# Patient Record
Sex: Male | Born: 1980 | Hispanic: No | Marital: Married | Smoking: Current some day smoker
Health system: Southern US, Community
[De-identification: ages and names within clinical notes are randomized; demographics above are authoritative.]

---

## 2001-09-29 ENCOUNTER — Encounter: Payer: Self-pay | Admitting: Emergency Medicine

## 2001-09-29 ENCOUNTER — Emergency Department (HOSPITAL_COMMUNITY): Admission: EM | Admit: 2001-09-29 | Discharge: 2001-09-29 | Payer: Self-pay | Admitting: Emergency Medicine

## 2013-05-02 ENCOUNTER — Encounter (HOSPITAL_COMMUNITY): Payer: Self-pay | Admitting: *Deleted

## 2013-05-02 ENCOUNTER — Emergency Department (HOSPITAL_COMMUNITY)
Admission: EM | Admit: 2013-05-02 | Discharge: 2013-05-02 | Disposition: A | Discharge status: Home / Self Care | Payer: No Typology Code available for payment source | Attending: Emergency Medicine | Admitting: Emergency Medicine

## 2013-05-02 DIAGNOSIS — Y929 Unspecified place or not applicable: Secondary | ICD-10-CM | POA: Insufficient documentation

## 2013-05-02 DIAGNOSIS — M542 Cervicalgia: Secondary | ICD-10-CM | POA: Insufficient documentation

## 2013-05-02 DIAGNOSIS — M25512 Pain in left shoulder: Secondary | ICD-10-CM

## 2013-05-02 DIAGNOSIS — M25519 Pain in unspecified shoulder: Secondary | ICD-10-CM | POA: Insufficient documentation

## 2013-05-02 DIAGNOSIS — M546 Pain in thoracic spine: Secondary | ICD-10-CM | POA: Insufficient documentation

## 2013-05-02 DIAGNOSIS — Y999 Unspecified external cause status: Secondary | ICD-10-CM | POA: Insufficient documentation

## 2013-05-02 MED ORDER — IBUPROFEN 800 MG PO TABS: 800.0000 mg | ORAL_TABLET | Freq: Three times a day (TID) | ORAL | Status: AC
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
## 2013-05-02 NOTE — ED Notes (Signed)
Pt c/o left shoulder and neck pain, sts he was just seen here about a week ago for the same issue but it hasn't gotten better. Pt in nad, skin warm and dry, resp e/u, ambulatory to room.
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
## 2013-05-04 NOTE — ED Provider Notes (Signed)
Medical screening examination/treatment/procedure(s) were performed by non-physician practitioner and as supervising physician I was immediately available for consultation/collaboration.  Dione Booze, MD 05/04/13 [phone]

## 2016-01-20 ENCOUNTER — Emergency Department (HOSPITAL_COMMUNITY)
Admission: EM | Admit: 2016-01-20 | Discharge: 2016-01-20 | Disposition: A | Discharge status: Home / Self Care | Payer: No Typology Code available for payment source | Attending: Emergency Medicine | Admitting: Emergency Medicine

## 2016-01-20 ENCOUNTER — Encounter (HOSPITAL_COMMUNITY): Payer: Self-pay | Admitting: Emergency Medicine

## 2016-01-20 ENCOUNTER — Emergency Department (HOSPITAL_COMMUNITY): Payer: No Typology Code available for payment source

## 2016-01-20 DIAGNOSIS — R51 Headache: Secondary | ICD-10-CM | POA: Insufficient documentation

## 2016-01-20 DIAGNOSIS — R509 Fever, unspecified: Secondary | ICD-10-CM | POA: Insufficient documentation

## 2016-01-20 DIAGNOSIS — R1110 Vomiting, unspecified: Secondary | ICD-10-CM | POA: Insufficient documentation

## 2016-01-20 DIAGNOSIS — R05 Cough: Secondary | ICD-10-CM | POA: Insufficient documentation

## 2016-01-20 DIAGNOSIS — Z791 Long term (current) use of non-steroidal anti-inflammatories (NSAID): Secondary | ICD-10-CM | POA: Insufficient documentation

## 2016-01-20 DIAGNOSIS — F17200 Nicotine dependence, unspecified, uncomplicated: Secondary | ICD-10-CM | POA: Insufficient documentation

## 2016-01-20 DIAGNOSIS — R6889 Other general symptoms and signs: Secondary | ICD-10-CM

## 2016-01-20 DIAGNOSIS — R0989 Other specified symptoms and signs involving the circulatory and respiratory systems: Secondary | ICD-10-CM | POA: Insufficient documentation

## 2016-01-20 DIAGNOSIS — R062 Wheezing: Secondary | ICD-10-CM | POA: Insufficient documentation

## 2016-01-20 MED ORDER — ALBUTEROL SULFATE HFA 108 (90 BASE) MCG/ACT IN AERS
2.0000 | INHALATION_SPRAY | Freq: Once | RESPIRATORY_TRACT | Status: AC
  Administered 2016-01-20: 2 via RESPIRATORY_TRACT
  Filled 2016-01-20: qty 6.7

## 2016-01-20 MED ORDER — PREDNISONE 20 MG PO TABS: 40.0000 mg | ORAL_TABLET | Freq: Every day | ORAL | Status: AC

## 2016-01-20 MED ORDER — GUAIFENESIN-CODEINE 100-10 MG/5ML PO SOLN: 5.0000 mL | Freq: Four times a day (QID) | ORAL | Status: AC | PRN

## 2016-01-20 MED ORDER — AZITHROMYCIN 250 MG PO TABS: ORAL_TABLET | ORAL | Status: AC

## 2016-01-20 MED ORDER — AEROCHAMBER PLUS W/MASK MISC
1.0000 | Freq: Once | Status: AC
  Administered 2016-01-20: 1
  Filled 2016-01-20: qty 1

## 2016-01-20 NOTE — Discharge Instructions (Signed)
Gripe - Adultos  (Influenza, Adult)  La gripe es una infección viral del tracto respiratorio. Ocurre con más frecuencia en los meses de invierno, ya que las personas pasan más tiempo en contacto cercano. La gripe puede enfermarlo considerablemente. Se transmite fácilmente de una persona a otra (es contagiosa).  CAUSAS   La causa es un virus que infecta el tracto respiratorio. Puede contagiarse el virus al aspirar las gotitas que una persona infectada elimina al toser o estornudar. También puede contagiarse al tocar algo que fue recientemente contaminado con el virus y luego llevarse la mano a la boca, la nariz o los ojos.  RIESGOS Y COMPLICACIONES  Tendrá mayor riesgo de sufrir un resfrío grave si consume cigarrillos, es diabético, sufre una enfermedad cardíaca (como insuficiencia cardíaca) o pulmonar crónica (como asma) o si tiene debilitado el sistema inmunológico. Los ancianos y las mujeres embarazadas tienen más riesgo de sufrir infecciones graves. El problema más frecuente de la gripe es la infección pulmonar (neumonía). En algunos casos, este problema puede requerir atención médica de emergencia y poner en peligro la vida.  SIGNOS Y SÍNTOMAS   Los síntomas pueden durar entre 4 y 10 días y pueden ser:  · Fiebre.  · Escalofríos.  · Dolor de cabeza, dolores en el cuerpo y musculares.  · Dolor de garganta.  · Molestias en el pecho y tos.  · Pérdida del apetito.  · Debilidad o cansancio.  · Mareos.  · Náuseas o vómitos.  DIAGNÓSTICO   El diagnóstico se realiza según su historia clínica y un examen físico. Es necesario realizar un análisis de cultivo faríngeo o nasal para confirmar el diagnóstico.  TRATAMIENTO   En los casos leves, la gripe se cura sin tratamiento. El tratamiento está dirigido a aliviar los síntomas. En los casos más graves, el médico podrá recetar medicamentos antivirales para acortar el curso de la enfermedad. Los antibióticos no son eficaces, ya que la infección está causada por un virus y no una  bacteria.  INSTRUCCIONES PARA EL CUIDADO EN EL HOGAR   · Tome los medicamentos solamente como se lo haya indicado el médico.  · Utilice un humidificador de niebla fría para facilitar la respiración.  · Haga reposo hasta que la temperatura vuelva a ser normal. Generalmente esto lleva entre 3 y 4 días.  · Beba suficiente líquido para mantener la orina clara o de color amarillo pálido.  · Cúbrase la boca y la nariz al toser o estornudar, y lávese las manos muy bien para evitar que se propague el virus.  · Quédese en su casa y no concurra al trabajo o a la escuela hasta que la fiebre haya desaparecido al menos por un día completo.  PREVENCIÓN   La vacunación anual contra la gripe es la mejor manera de evitar enfermarse. Se recomienda ahora de manera rutinaria una vacuna anual contra la gripe a todos los adultos estadounidenses.  SOLICITE ATENCIÓN MÉDICA SI:  · Tiene dolor en el pecho, la tos empeora o tiene más mucosidad.  · Tiene náuseas, vómitos o diarrea.  · La fiebre regresa o empeora.  SOLICITE ATENCIÓN MÉDICA DE INMEDIATO SI:   · Tiene dificultad para respirar, le falta el aire o tiene la piel o las uñas azuladas.  · Presenta dolor intenso o entumecimiento en el cuello.  · Le duele la cabeza de forma repentina o tiene dolor en la cara o el oído.  · Tiene náuseas o vómitos que no puede controlar.  ASEGÚRESE DE QUE:   · Comprende   estas instrucciones.  · Controlará su afección.  · Recibirá ayuda de inmediato si no mejora o si empeora.     Esta información no tiene como fin reemplazar el consejo del médico. Asegúrese de hacerle al médico cualquier pregunta que tenga.     Document Released: 08/31/2005 Document Revised: 12/12/2014  Elsevier Interactive Patient Education ©2016 [employer]

## 2016-01-20 NOTE — ED Notes (Signed)
Patient presents with generalized body aches, emesis, fever cough x4 days.

## 2016-01-20 NOTE — ED Provider Notes (Signed)
CSN: [id number]     Arrival date & time 01/20/16  1623 History  By signing my name below, I, Phillis Haggis, attest that this documentation has been prepared under the direction and in the presence of Arthor Captain, PA-C. Electronically Signed: Phillis Haggis, ED Scribe. 01/20/2016. 7:39 PM.   Chief Complaint  Patient presents with  . Fever  . Cough  . Emesis   The history is provided by the patient. No language interpreter was used.  HPI Comments: William Graham is a 35 y.o. male who presents to the Emergency Department complaining of gradually worsening generalized myalgias onset 4 days ago. Pt reports associated emesis, fever, congestion, headache, and cough. He reports occasional alcohol consumption and smoking. He reports familial sick contacts. He has not tried anything for his symptoms. He denies hx of asthma or recent travel outside of the country.   History reviewed. No pertinent past medical history. History reviewed. No pertinent past surgical history. No family history on file. Social History  Substance Use Topics  . Smoking status: Current Some Day Smoker  . Smokeless tobacco: None  . Alcohol Use: No    Review of Systems  Constitutional: Positive for fever.  HENT: Positive for congestion.   Respiratory: Positive for cough.   Gastrointestinal: Positive for vomiting.  Musculoskeletal: Positive for myalgias.   Allergies  Review of patient's allergies indicates no known allergies.  Home Medications   Prior to Admission medications   Medication Sig Start Date End Date Taking? Authorizing Provider  acetaminophen (TYLENOL) 500 MG tablet Take 1,000 mg by mouth every 6 (six) hours as needed for pain.    Historical Provider, MD  ibuprofen (ADVIL,MOTRIN) 800 MG tablet Take 1 tablet (800 mg total) by mouth 3 (three) times daily. 05/02/13   Junius Finner, PA-C  methocarbamol (ROBAXIN) 500 MG tablet Take 500 mg by mouth 2 (two) times daily as needed (for muscle relaxant).     Historical Provider, MD  oxyCODONE-acetaminophen (PERCOCET/ROXICET) 5-325 MG per tablet Take 1 tablet by mouth every 4 (four) hours as needed for pain.    Historical Provider, MD   BP 136/95 mmHg  Pulse 82  Temp(Src) 98 F (36.7 C) (Oral)  Resp 18  SpO2 100% Physical Exam Physical Exam  Constitutional: Pt  is oriented to person, place, and time. Appears well-developed and well-nourished. No distress.  HENT:  Head: Normocephalic and atraumatic.  Right Ear: Tympanic membrane, external ear and ear canal normal.  Left Ear: Tympanic membrane, external ear and ear canal normal.  Nose: No epistaxis. Right sinus exhibits no maxillary sinus tenderness and no frontal sinus tenderness. Left sinus exhibits no maxillary sinus tenderness and no frontal sinus tenderness.  Mouth/Throat: Uvula is midline and mucous membranes are normal. Mucous membranes are not pale and not cyanotic. No oropharyngeal exudate, posterior oropharyngeal edema, posterior oropharyngeal erythema or tonsillar abscesses.  Eyes: Conjunctivae are normal. Pupils are equal, round, and reactive to light.  Neck: Normal range of motion and full passive range of motion without pain.  Cardiovascular: Normal rate and intact distal pulses.   Pulmonary/Chest: No stridor. Bilateral rhonchi and wheezing. Coughing on exam Abdominal: Soft. Bowel sounds are normal. There is no tenderness.  Musculoskeletal: Normal range of motion. Tenderness to the right latissimus Lymphadenopathy:    Pthas no cervical adenopathy.  Neurological: Pt is alert and oriented to person, place, and time.  Skin: Skin is warm and dry. No rash noted. Pt is not diaphoretic.  Psychiatric: Normal mood and affect.  Nursing note  and vitals reviewed.   ED Course  Procedures (including critical care time) DIAGNOSTIC STUDIES: Oxygen Saturation is 100% on RA, normal by my interpretation.    COORDINATION OF CARE: 7:35 PM-Discussed treatment plan which includes cough  medication and x-ray with pt at bedside and pt agreed to plan.    Labs Review Labs Reviewed - No data to display  Imaging Review Dg Chest 2 View  01/20/2016  CLINICAL DATA:  Cough for 4 days EXAM: CHEST  2 VIEW COMPARISON:  None FINDINGS: Normal heart size, mediastinal contours, and pulmonary vascularity. Mild peribronchial thickening. No pulmonary infiltrate, pleural effusion, or pneumothorax. Bones unremarkable. IMPRESSION: Bronchitic changes without infiltrate. Electronically Signed   By: Ulyses Southward M.D.   On: 01/20/2016 17:25   I have personally reviewed and evaluated these images and lab results as part of my medical decision-making.   EKG Interpretation None      MDM   Final diagnoses:  Flu-like symptoms   Pt CXR negative for acute infiltrate. Patients symptoms are consistent with URI, likely viral etiology.  Pt will be discharged with symptomatic treatment, PREDNISONE AND ZPAK.  Verbalizes understanding and is agreeable with plan. Pt is hemodynamically stable & in NAD prior to dc.   I personally performed the services described in this documentation, which was scribed in my presence. The recorded information has been reviewed and is accurate.     Arthor Captain, PA-C 01/20/16 1953  Azalia Bilis, MD 01/21/16 [phone]

## 2017-04-22 IMAGING — CR DG CHEST 2V
2 series · 2 of 2 positions shown · non-contrast
Comparison: None

CLINICAL DATA: Cough for 4 days

EXAM:
CHEST  2 VIEW

[w chest pa]
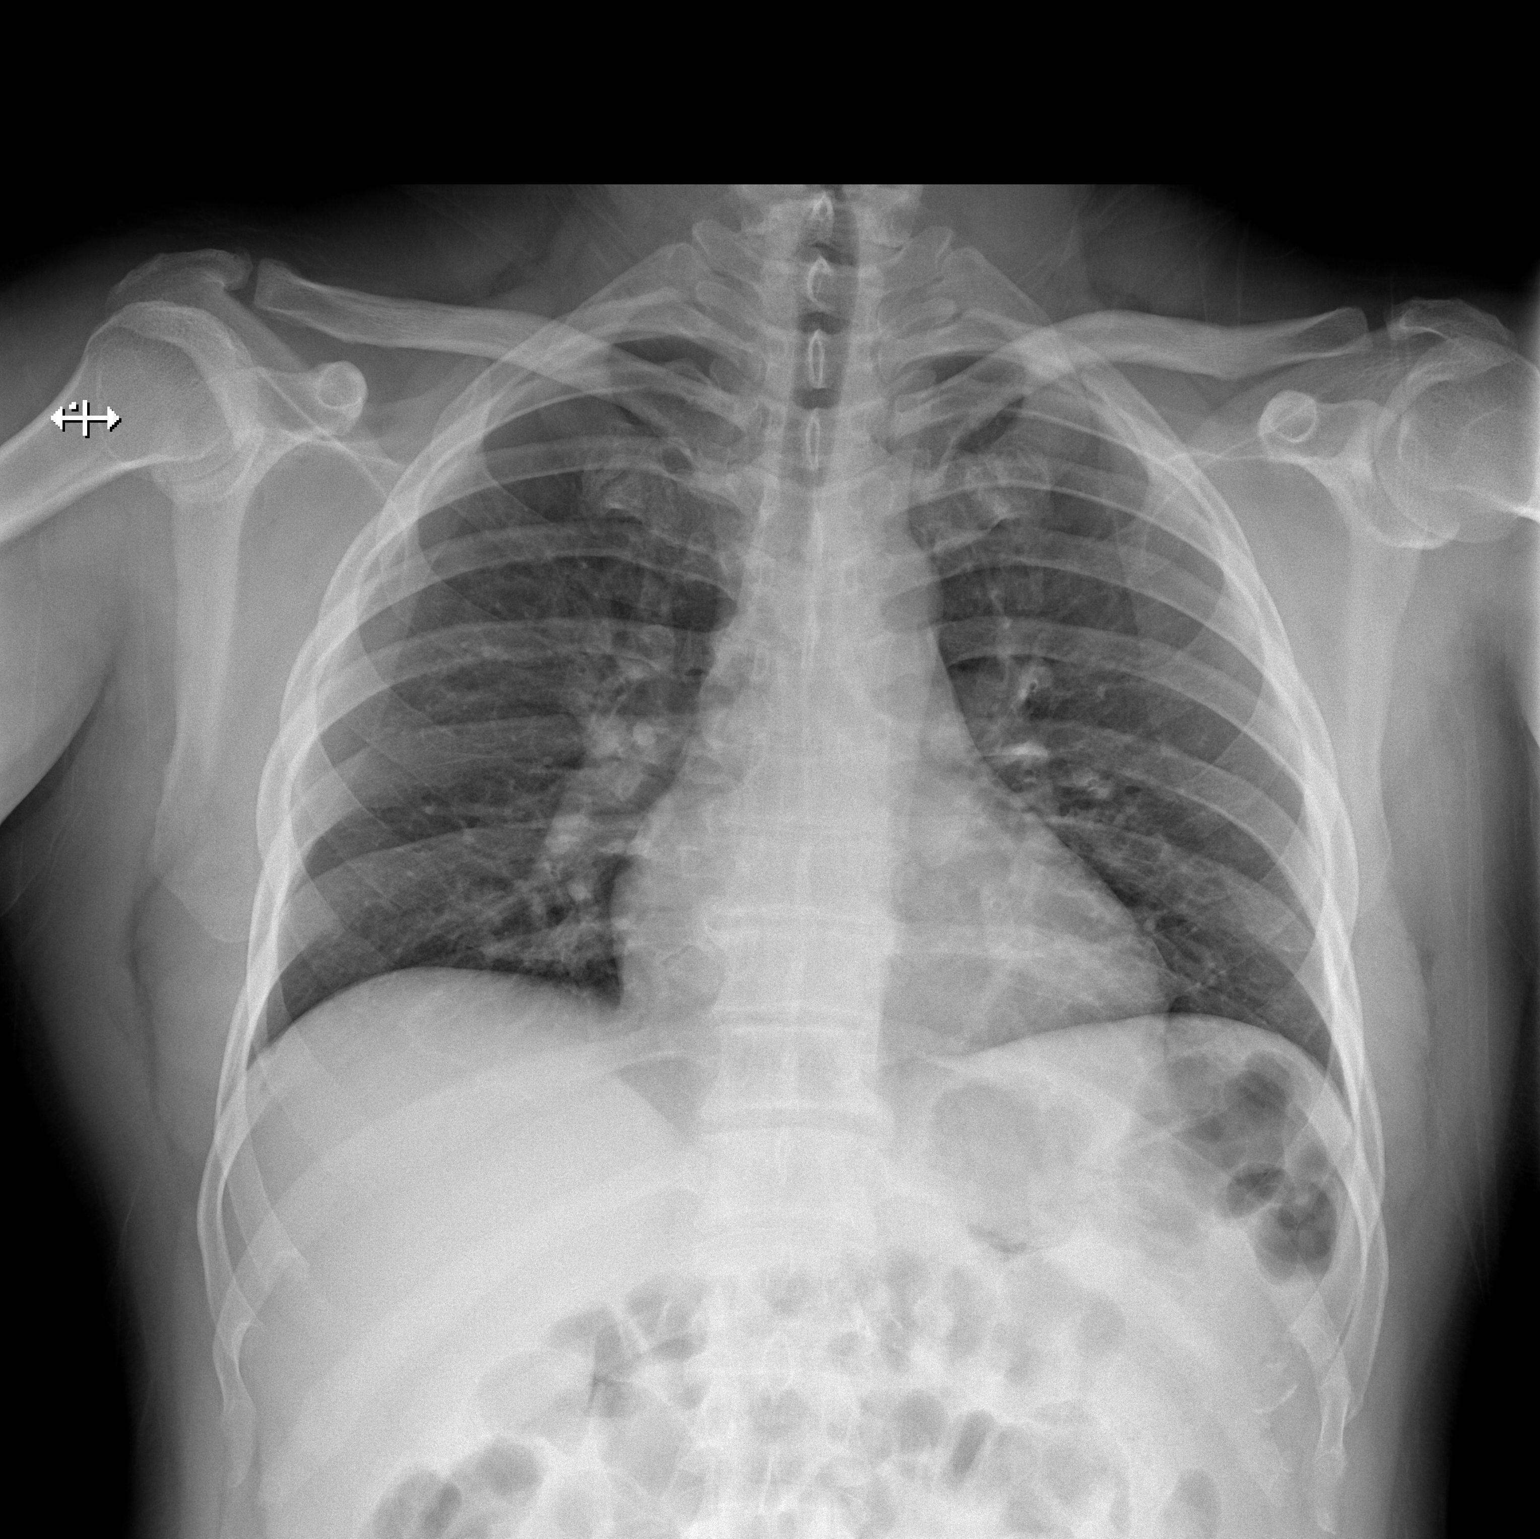

[w chest lat]
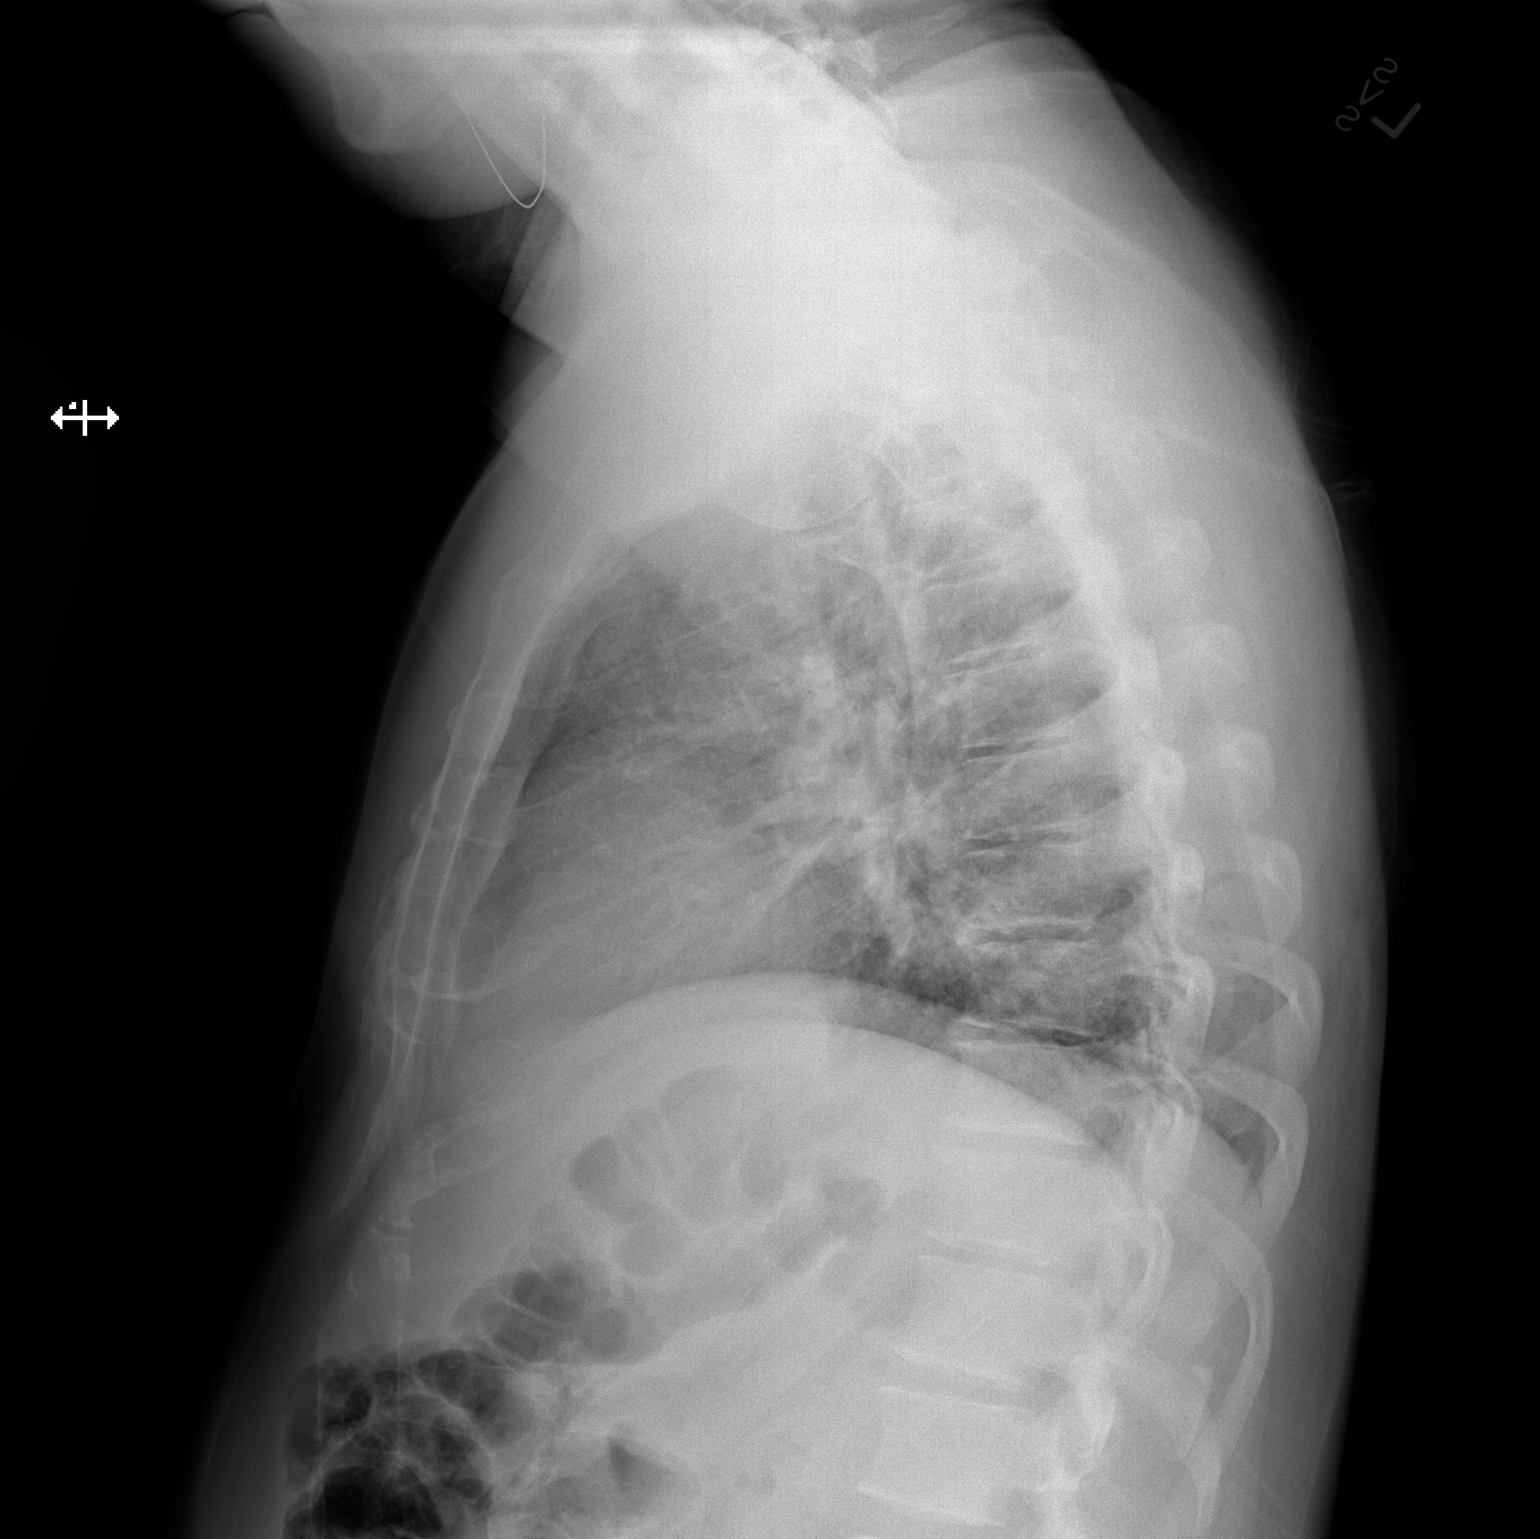

[2 of 2 positions shown; findings below may reference images not displayed]

FINDINGS: Normal heart size, mediastinal contours, and pulmonary vascularity.

Mild peribronchial thickening.

No pulmonary infiltrate, pleural effusion, or pneumothorax.

Bones unremarkable.
IMPRESSION: Bronchitic changes without infiltrate.
